# Patient Record
Sex: Male | Born: 1995
Health system: Southern US, Community
[De-identification: ages and names within clinical notes are randomized; demographics above are authoritative.]

---
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
## 2013-12-01 IMAGING — US ABDOMEN ULTRASOUND LIMITED
1 series · 14 of 25 positions shown · non-contrast
Comparison: none

REASON FOR EXAM: right upper quadrant pain
COMMENTS:   Body Site: GB and Fossa, CBD, Head of Pancreas

PROCEDURE:     US  - US ABDOMEN LIMITED SURVEY  - June 26, 2012  [DATE]
RESULT:     Comparison: None.
TECHNIQUE: Multiple grayscale and color Doppler images were obtained of the
right upper quadrant.

[Series 1: abdomen ultrasound limited · 0.31mm/px · 14 of 57 slices shown]
[im 1/57]
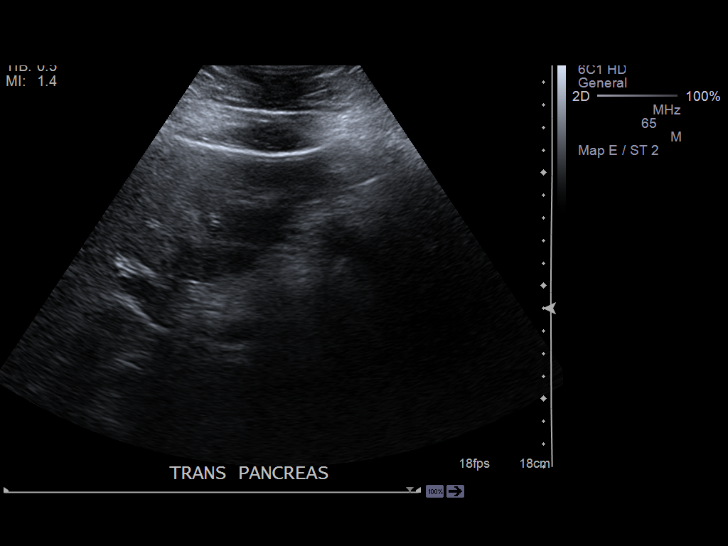
[im 5/57]
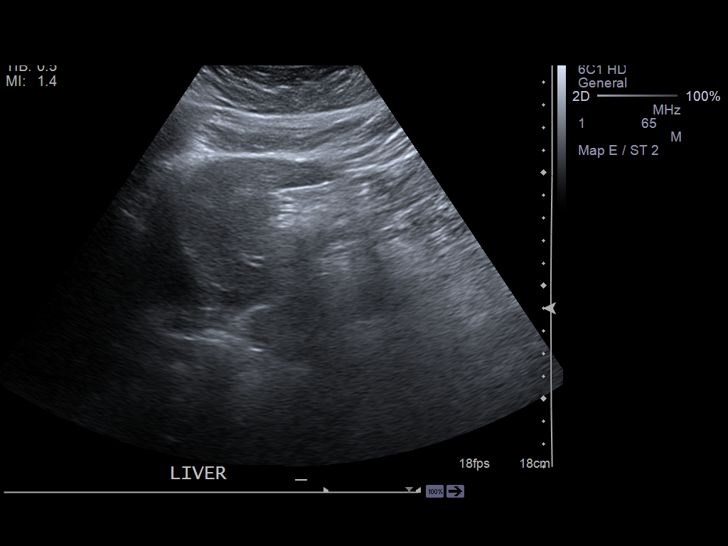
[im 10/57]
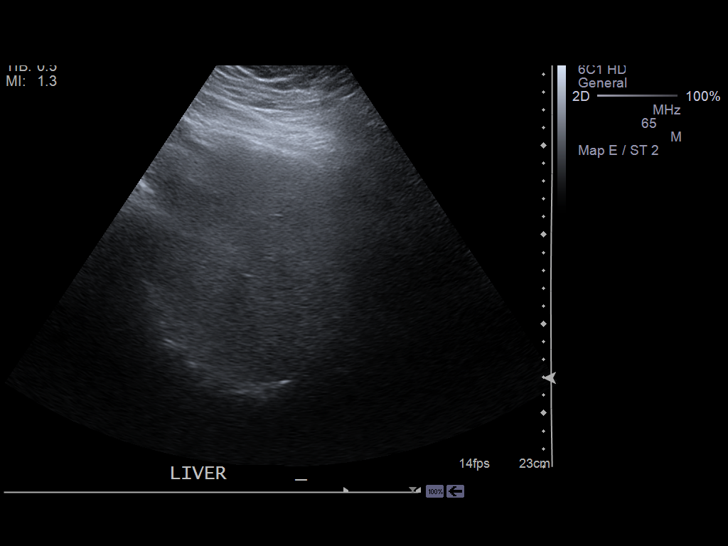
[im 15/57]
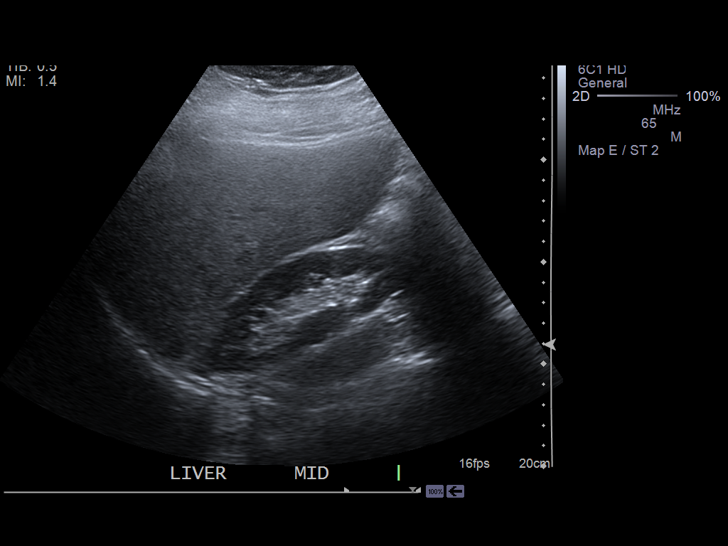
[im 19/57]
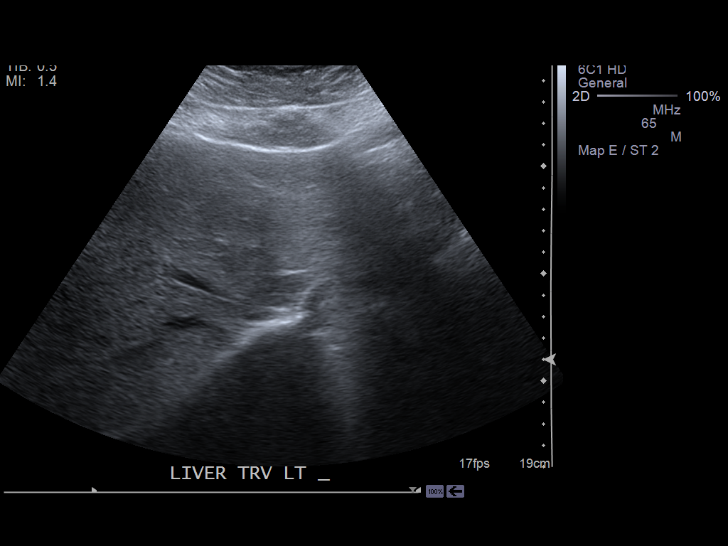
[im 22/57]
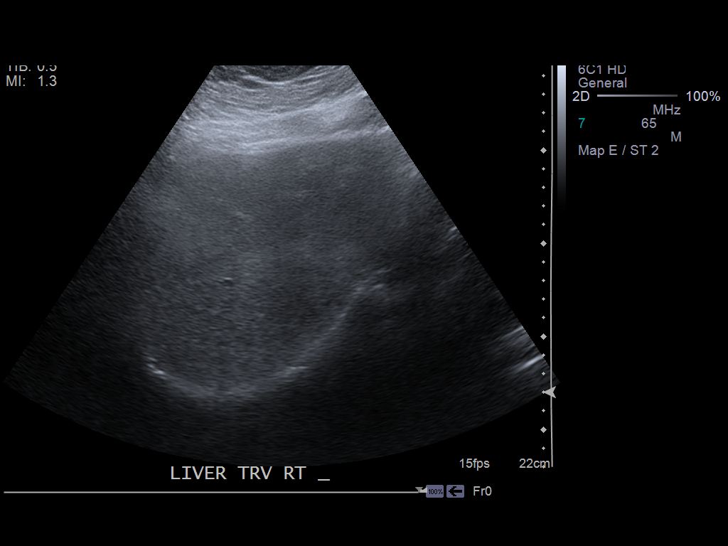
[im 26/57]
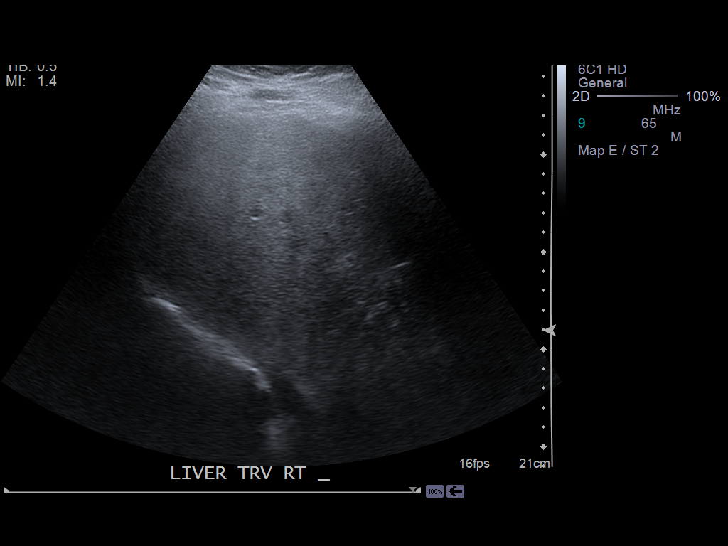
[im 31/57]
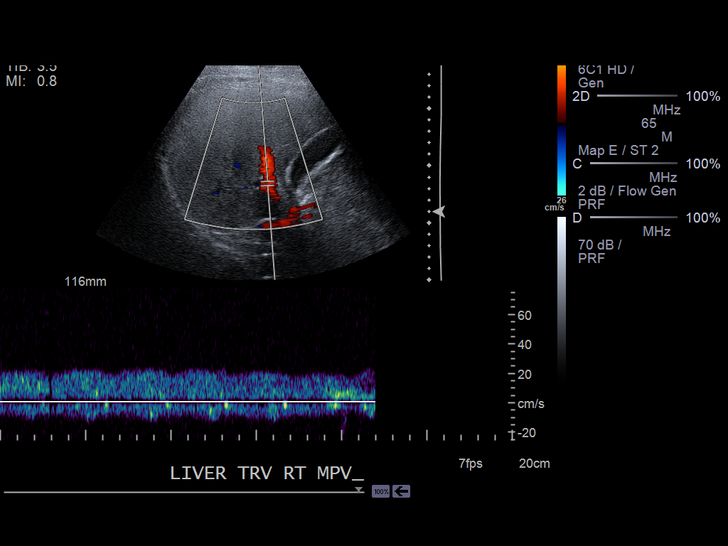
[im 36/57]
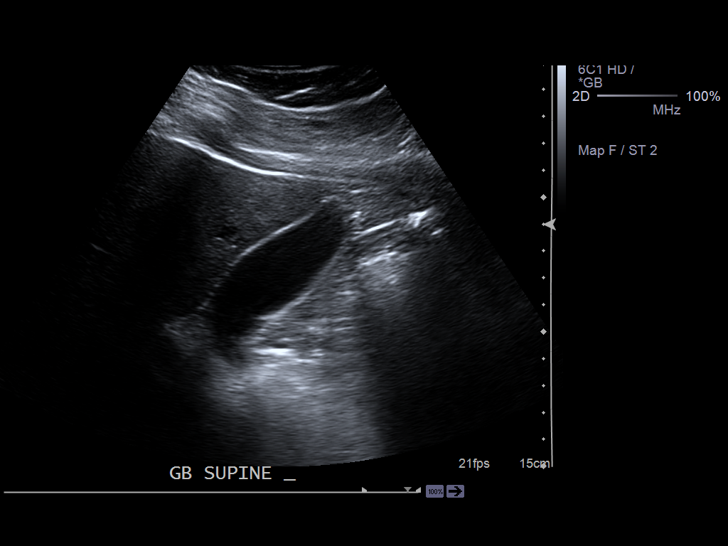
[im 38/57]
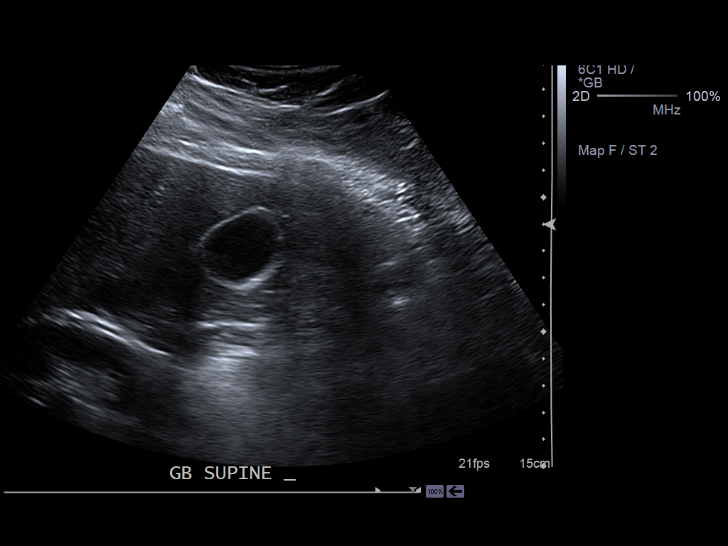
[im 43/57]
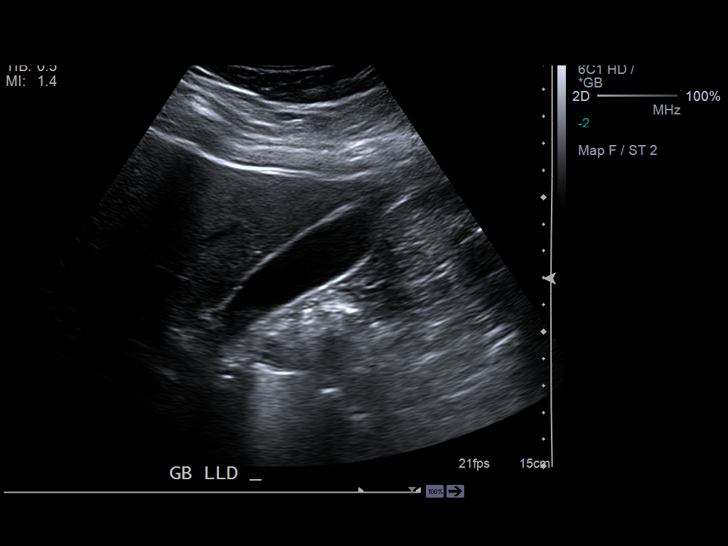
[im 47/57]
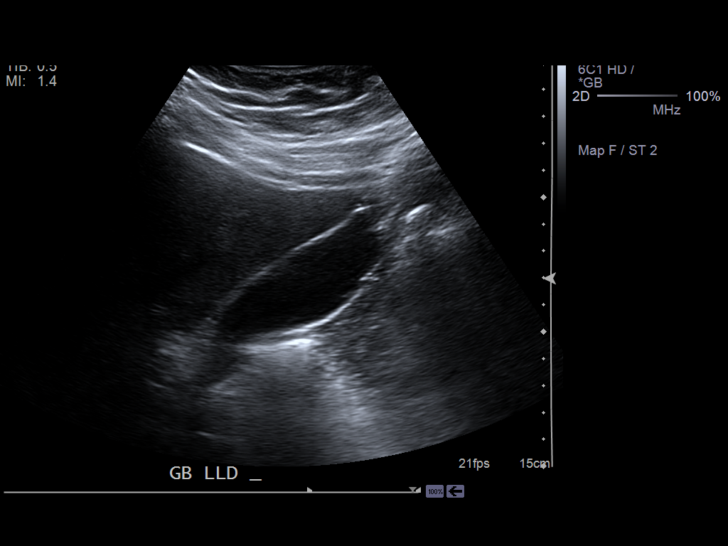
[im 52/57]
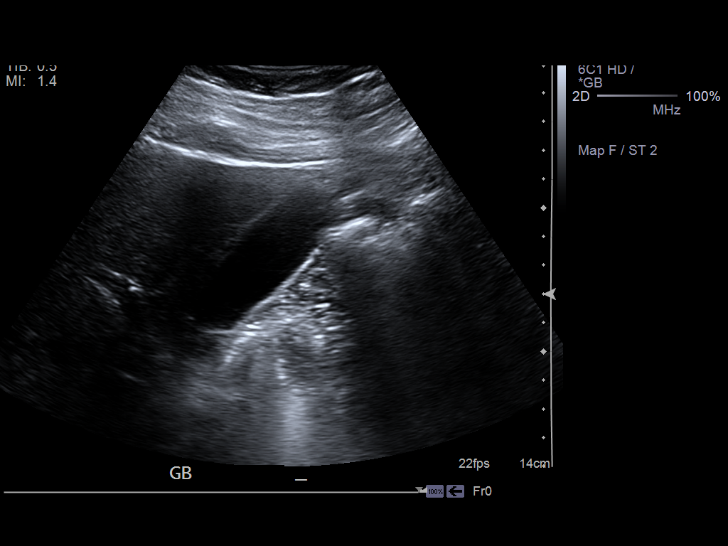
[im 57/57]
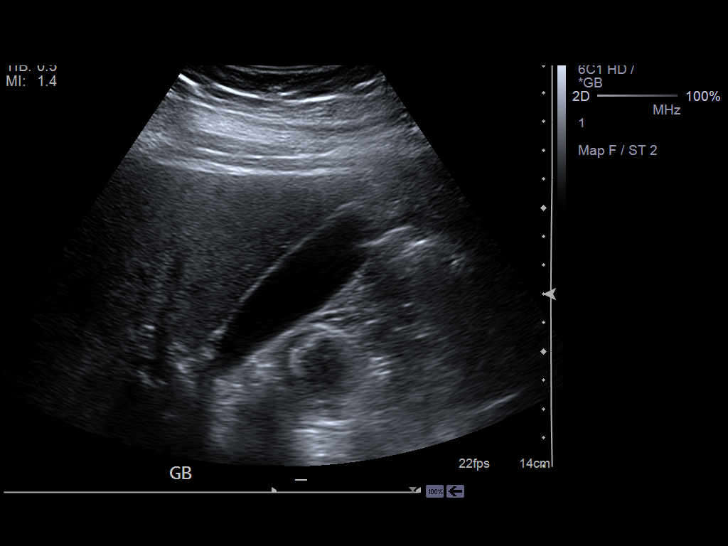

[14 of 25 positions shown; findings below may reference images not displayed]

FINDINGS: The pancreas was mostly obscured by overlying bowel gas. The liver is
relatively echogenic and dense, as can be seen with hepatic steatosis. The
main portal vein is patent. The gallbladder is normal. The sonographic
Murphy sign was negative. The common bile duct measures 3 mm in diameter.
IMPRESSION: 1. Normal gallbladder.
2. The pancreas was obscured.

[REDACTED]

## 2015-06-02 ENCOUNTER — Ambulatory Visit: Payer: Self-pay | Admitting: Family Medicine

## 2017-03-03 DIAGNOSIS — J019 Acute sinusitis, unspecified: Secondary | ICD-10-CM | POA: Diagnosis not present

## 2017-10-04 DIAGNOSIS — I10 Essential (primary) hypertension: Secondary | ICD-10-CM | POA: Diagnosis not present

## 2017-10-04 DIAGNOSIS — K219 Gastro-esophageal reflux disease without esophagitis: Secondary | ICD-10-CM | POA: Diagnosis not present

## 2018-03-14 DIAGNOSIS — L249 Irritant contact dermatitis, unspecified cause: Secondary | ICD-10-CM | POA: Diagnosis not present

## 2018-06-11 DIAGNOSIS — L259 Unspecified contact dermatitis, unspecified cause: Secondary | ICD-10-CM | POA: Diagnosis not present

## 2022-03-22 ENCOUNTER — Ambulatory Visit
Admission: EM | Admit: 2022-03-22 | Discharge: 2022-03-22 | Disposition: A | Discharge status: Home / Self Care | Payer: No Typology Code available for payment source | Attending: Nurse Practitioner | Admitting: Nurse Practitioner

## 2022-03-22 ENCOUNTER — Encounter: Payer: Self-pay | Admitting: *Deleted

## 2022-03-22 ENCOUNTER — Other Ambulatory Visit: Payer: Self-pay

## 2022-03-22 DIAGNOSIS — R051 Acute cough: Secondary | ICD-10-CM | POA: Diagnosis not present

## 2022-03-22 DIAGNOSIS — B349 Viral infection, unspecified: Secondary | ICD-10-CM

## 2022-03-22 DIAGNOSIS — J029 Acute pharyngitis, unspecified: Secondary | ICD-10-CM | POA: Diagnosis present

## 2022-03-22 DIAGNOSIS — U071 COVID-19: Secondary | ICD-10-CM | POA: Diagnosis not present

## 2022-03-22 LAB — RESP PANEL BY RT-PCR (FLU A&B, COVID) ARPGX2
Influenza A by PCR: NEGATIVE
Influenza B by PCR: NEGATIVE
SARS Coronavirus 2 by RT PCR: POSITIVE — AB

## 2022-03-22 LAB — GROUP A STREP BY PCR: Group A Strep by PCR: NOT DETECTED

## 2022-03-22 NOTE — ED Triage Notes (Signed)
Pt has a cough,sore throat and a HA. Pt has tried tylenol with out any relief.

## 2022-03-22 NOTE — Discharge Instructions (Signed)
Rest and fluids Over the counter cough medication as needed Follow up with your PCP in 2-3 days for re-check  Please go to the ER for any worsening symptoms I hope you feel better soon!

## 2022-03-22 NOTE — ED Provider Notes (Signed)
[facility]    CSN: [id number] Arrival date & time: 03/22/22  1455      History   Chief Complaint Chief Complaint  Patient presents with   Cough    HPI Rickey Carroll is a 26 y.o. male presents for evaluation of cough. Pt reports 2 days of a cough with ST, congestion, and chills. Denies fevers, ear pain, body aches, N/V/D, or SOB. No asthma hx. He is a tobacco vaper. He is vaccinated for flu and covid. He has taken tylenol OTC for sx. No other concerns at this time.    Cough Associated symptoms: chills and sore throat     History reviewed. No pertinent past medical history.  There are no problems to display for this patient.   History reviewed. No pertinent surgical history.     Home Medications    Prior to Admission medications   Not on File    Family History History reviewed. No pertinent family history.  Social History Social History   Tobacco Use   Smoking status: Never   Smokeless tobacco: Never     Allergies   Patient has no known allergies.   Review of Systems Review of Systems  Constitutional:  Positive for chills.  HENT:  Positive for congestion and sore throat.   Respiratory:  Positive for cough.      Physical Exam Triage Vital Signs ED Triage Vitals  Enc Vitals Group     BP 03/22/22 1651 (!) 134/91     Pulse Rate 03/22/22 1651 92     Resp 03/22/22 1651 20     Temp 03/22/22 1651 99 F (37.2 C)     Temp src --      SpO2 03/22/22 1651 93 %     Weight --      Height --      Head Circumference --      Peak Flow --      Pain Score 03/22/22 1648 5     Pain Loc --      Pain Edu? --      Excl. in GC? --    No data found.  Updated Vital Signs BP (!) 134/91   Pulse 92   Temp 99 F (37.2 C)   Resp 20   SpO2 93%   Visual Acuity Right Eye Distance:   Left Eye Distance:   Bilateral Distance:    Right Eye Near:   Left Eye Near:    Bilateral Near:     Physical Exam Vitals and nursing note reviewed.   Constitutional:      General: He is not in acute distress.    Appearance: Normal appearance. He is not ill-appearing or toxic-appearing.  HENT:     Head: Normocephalic and atraumatic.     Right Ear: Tympanic membrane and ear canal normal.     Left Ear: Tympanic membrane and ear canal normal.     Nose: Congestion present.     Mouth/Throat:     Mouth: Mucous membranes are moist.     Pharynx: Posterior oropharyngeal erythema present.  Eyes:     Pupils: Pupils are equal, round, and reactive to light.  Cardiovascular:     Rate and Rhythm: Normal rate and regular rhythm.     Heart sounds: Normal heart sounds.  Pulmonary:     Effort: Pulmonary effort is normal.     Breath sounds: Normal breath sounds.  Musculoskeletal:     Cervical back: Normal range of motion and  neck supple.  Lymphadenopathy:     Cervical: No cervical adenopathy.  Skin:    General: Skin is warm and dry.  Neurological:     General: No focal deficit present.     Mental Status: He is alert and oriented to person, place, and time.  Psychiatric:        Mood and Affect: Mood normal.        Behavior: Behavior normal.      UC Treatments / Results  Labs (all labs ordered are listed, but only abnormal results are displayed) Labs Reviewed  GROUP A STREP BY PCR  RESP PANEL BY RT-PCR (FLU A&B, COVID) [id number]    EKG   Radiology No results found.  Procedures Procedures (including critical care time)  Medications Ordered in UC Medications - No data to display  Initial Impression / Assessment and Plan / UC Course  I have reviewed the triage vital signs and the nursing notes.  Pertinent labs & imaging results that were available during my care of the patient were reviewed by me and considered in my medical decision making (see chart for details).  Clinical Course as of 03/22/22 1710  Tue Mar 22, 2022  1710 O2 recheck was 94% and pt in no respiratory distress  [JM]    Clinical Course User Index [JM] Radford Pax, NP    Discussed with patient viral illness and symptomatic treatment  COVID, Fl, and Strep PCR Rest and fluids OTC cough medication as needed  Follow up with PCP in 2-3 days for re-check  ER precautions reviewed and pt verbalized understanding  Final Clinical Impressions(s) / UC Diagnoses   Final diagnoses:  Acute cough  Sore throat  Viral illness     Discharge Instructions      Rest and fluids Over the counter cough medication as needed Follow up with your PCP in 2-3 days for re-check  Please go to the ER for any worsening symptoms I hope you feel better soon!    ED Prescriptions   None    PDMP not reviewed this encounter.   Radford Pax, NP 03/22/22 1710

## 2022-03-23 ENCOUNTER — Encounter: Payer: Self-pay | Admitting: *Deleted
